# Patient Record
Sex: Female | Born: 1995 | Race: White | Hispanic: No | Marital: Single | Smoking: Never smoker
Health system: Southern US, Community
[De-identification: ages and names within clinical notes are randomized; demographics above are authoritative.]

## PROBLEM LIST (undated history)

## (undated) DIAGNOSIS — N898 Other specified noninflammatory disorders of vagina: Principal | ICD-10-CM

## (undated) DIAGNOSIS — N926 Irregular menstruation, unspecified: Principal | ICD-10-CM

## (undated) DIAGNOSIS — F419 Anxiety disorder, unspecified: Secondary | ICD-10-CM

## (undated) DIAGNOSIS — B373 Candidiasis of vulva and vagina: Secondary | ICD-10-CM

## (undated) DIAGNOSIS — D573 Sickle-cell trait: Secondary | ICD-10-CM

## (undated) DIAGNOSIS — Z8619 Personal history of other infectious and parasitic diseases: Principal | ICD-10-CM

## (undated) DIAGNOSIS — F32A Depression, unspecified: Secondary | ICD-10-CM

## (undated) DIAGNOSIS — A749 Chlamydial infection, unspecified: Secondary | ICD-10-CM

## (undated) DIAGNOSIS — R011 Cardiac murmur, unspecified: Secondary | ICD-10-CM

## (undated) HISTORY — DX: Other specified noninflammatory disorders of vagina: N89.8

## (undated) HISTORY — DX: Sickle-cell trait: D57.3

## (undated) HISTORY — DX: Irregular menstruation, unspecified: N92.6

## (undated) HISTORY — PX: NO PAST SURGERIES: SHX2092

## (undated) HISTORY — DX: Chlamydial infection, unspecified: A74.9

## (undated) HISTORY — DX: Cardiac murmur, unspecified: R01.1

## (undated) HISTORY — DX: Candidiasis of vulva and vagina: B37.3

## (undated) HISTORY — DX: Personal history of other infectious and parasitic diseases: Z86.19

---
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
## 2016-03-19 ENCOUNTER — Encounter: Payer: Self-pay | Admitting: Obstetrics and Gynecology

## 2016-03-19 ENCOUNTER — Ambulatory Visit (INDEPENDENT_AMBULATORY_CARE_PROVIDER_SITE_OTHER): Payer: Medicaid Other | Admitting: Obstetrics and Gynecology

## 2016-03-19 VITALS — BP 120/80 | Ht 63.5 in | Wt 198.0 lb

## 2016-03-19 DIAGNOSIS — A499 Bacterial infection, unspecified: Secondary | ICD-10-CM

## 2016-03-19 DIAGNOSIS — N760 Acute vaginitis: Secondary | ICD-10-CM | POA: Diagnosis not present

## 2016-03-19 DIAGNOSIS — B9689 Other specified bacterial agents as the cause of diseases classified elsewhere: Secondary | ICD-10-CM

## 2016-03-19 DIAGNOSIS — N898 Other specified noninflammatory disorders of vagina: Secondary | ICD-10-CM

## 2016-03-19 MED ORDER — METRONIDAZOLE 500 MG PO TABS: 500.0000 mg | ORAL_TABLET | Freq: Two times a day (BID) | ORAL | Status: DC

## 2016-03-19 NOTE — Progress Notes (Signed)
   [facility]  Patient name: Carmen Sanders MRN [id number]  Date of birth: 03/20/1996  CC & HPI:  Carmen Sanders is a 20 y.o. female presenting today for recurrent, white, thick, vaginal discharge that began in the past week. She reports a history of similar symptoms in the past that were diagnosed as yeast infections/BV. She notes a resolved, transient episode of mild bleeding several days ago that she noticed when she wiped. She states she is not currently using contraceptives.  LMP: 02/13/16  ROS:  ROS Positive for resolved, transient episode of vaginal bleeding (mild) Positive for white, thick, vaginal discharge  Pertinent History Reviewed:   Reviewed: Significant for irregular menstrual bleeding, yeast vaginitis, vaginal discharge, laparoscopic appendectomy Medical         Past Medical History  Diagnosis Date  . Heart murmur   . Sickle cell trait (HCC)   . Irregular menstrual bleeding 08/07/2014  . Yeast vaginitis 08/07/2014  . Vaginal discharge 04/25/2015                              Surgical Hx:    Past Surgical History  Procedure Laterality Date  . No past surgeries    . Laparoscopic appendectomy N/A 07/26/2015    Procedure: APPENDECTOMY LAPAROSCOPIC;  Surgeon: Franky MachoMark Jenkins, MD;  Location: [facility];  Service: General;  Laterality: N/A;   Medications: Reviewed & Updated - see associated section                      No current outpatient prescriptions on file.   Social History: Reviewed -  reports that she has never smoked. She has never used smokeless tobacco.  Objective Findings:  Vitals: Blood pressure 120/80, height 5' 3.5" (1.613 m), weight 198 lb (89.812 kg), last menstrual period 02/14/2016.  Physical Examination: General appearance - alert, well appearing, and in no distress, oriented to person, place, and time and overweight Mental status - alert, oriented to person, place, and time, normal mood, behavior, speech, dress, motor activity, and thought  processes, affect appropriate to mood Pelvic -   VULVA: normal appearing vulva with no masses, tenderness or lesions,  VAGINA: moderate amount of white vaginal discharge CERVIX: normal appearing cervix without discharge or lesions  Wet Prep Negative for yeast, negative for BV clue cells, but contaminated by Lubricant.negative for trichomonas  Assessment & Plan:   A:  1. Vaginal discharge.   2. ? bv  P:  1. Rx metronidazole for 7 days.  By signing my name below, I, Ronney LionSuzanne Le, attest that this documentation has been prepared under the direction and in the presence of Tilda BurrowJohn Miranda Frese V, MD. Electronically Signed: Ronney LionSuzanne Le, ED Scribe. 03/19/2016. 3:10 PM.  I personally performed the services described in this documentation, which was SCRIBED in my presence. The recorded information has been reviewed and considered accurate. It has been edited as necessary during review. Tilda BurrowFERGUSON,Susie Pousson V, MD

## 2016-04-22 ENCOUNTER — Ambulatory Visit (INDEPENDENT_AMBULATORY_CARE_PROVIDER_SITE_OTHER): Payer: Medicaid Other | Admitting: Women's Health

## 2016-04-22 ENCOUNTER — Encounter: Payer: Self-pay | Admitting: Women's Health

## 2016-04-22 VITALS — BP 138/74 | HR 72 | Wt 196.0 lb

## 2016-04-22 DIAGNOSIS — N898 Other specified noninflammatory disorders of vagina: Secondary | ICD-10-CM

## 2016-04-22 DIAGNOSIS — B373 Candidiasis of vulva and vagina: Secondary | ICD-10-CM | POA: Diagnosis not present

## 2016-04-22 DIAGNOSIS — Z3202 Encounter for pregnancy test, result negative: Secondary | ICD-10-CM | POA: Diagnosis not present

## 2016-04-22 DIAGNOSIS — B3731 Acute candidiasis of vulva and vagina: Secondary | ICD-10-CM

## 2016-04-22 DIAGNOSIS — N926 Irregular menstruation, unspecified: Secondary | ICD-10-CM

## 2016-04-22 LAB — POCT WET PREP (WET MOUNT): Clue Cells Wet Prep Whiff POC: NEGATIVE

## 2016-04-22 LAB — POCT URINE PREGNANCY: Preg Test, Ur: NEGATIVE

## 2016-04-22 MED ORDER — FLUCONAZOLE 150 MG PO TABS: 150.0000 mg | ORAL_TABLET | Freq: Once | ORAL | Status: DC

## 2016-04-22 NOTE — Patient Instructions (Signed)
Condoms always for Sexually Transmitted Disease prevention Get back on birth control before sexually active again Take pregnancy test weekly until period starts  Monilial Vaginitis Vaginitis in a soreness, swelling and redness (inflammation) of the vagina and vulva. Monilial vaginitis is not a sexually transmitted infection. CAUSES  Yeast vaginitis is caused by yeast (candida) that is normally found in your vagina. With a yeast infection, the candida has overgrown in number to a point that upsets the chemical balance. SYMPTOMS   White, thick vaginal discharge.  Swelling, itching, redness and irritation of the vagina and possibly the lips of the vagina (vulva).  Burning or painful urination.  Painful intercourse. DIAGNOSIS  Things that may contribute to monilial vaginitis are:  Postmenopausal and virginal states.  Pregnancy.  Infections.  Being tired, sick or stressed, especially if you had monilial vaginitis in the past.  Diabetes. Good control will help lower the chance.  Birth control pills.  Tight fitting garments.  Using bubble bath, feminine sprays, douches or deodorant tampons.  Taking certain medications that kill germs (antibiotics).  Sporadic recurrence can occur if you become ill. TREATMENT  Your caregiver will give you medication.  There are several kinds of anti monilial vaginal creams and suppositories specific for monilial vaginitis. For recurrent yeast infections, use a suppository or cream in the vagina 2 times a week, or as directed.  Anti-monilial or steroid cream for the itching or irritation of the vulva may also be used. Get your caregiver's permission.  Painting the vagina with methylene blue solution may help if the monilial cream does not work.  Eating yogurt may help prevent monilial vaginitis. HOME CARE INSTRUCTIONS   Finish all medication as prescribed.  Do not have sex until treatment is completed or after your caregiver tells you it is  okay.  Take warm sitz baths.  Do not douche.  Do not use tampons, especially scented ones.  Wear cotton underwear.  Avoid tight pants and panty hose.  Tell your sexual partner that you have a yeast infection. They should go to their caregiver if they have symptoms such as mild rash or itching.  Your sexual partner should be treated as well if your infection is difficult to eliminate.  Practice safer sex. Use condoms.  Some vaginal medications cause latex condoms to fail. Vaginal medications that harm condoms are:  Cleocin cream.  Butoconazole (Femstat).  Terconazole (Terazol) vaginal suppository.  Miconazole (Monistat) (may be purchased over the counter). SEEK MEDICAL CARE IF:   You have a temperature by mouth above 102 F (38.9 C).  The infection is getting worse after 2 days of treatment.  The infection is not getting better after 3 days of treatment.  You develop blisters in or around your vagina.  You develop vaginal bleeding, and it is not your menstrual period.  You have pain when you urinate.  You develop intestinal problems.  You have pain with sexual intercourse.   This information is not intended to replace advice given to you by your health care provider. Make sure you discuss any questions you have with your health care provider.   Document Released: 09/10/2005 Document Revised: 02/23/2012 Document Reviewed: 06/04/2015 Elsevier Interactive Patient Education [employer]2016 Elsevier Inc.

## 2016-04-22 NOTE — Progress Notes (Signed)
Patient ID: Carmen BrasCierra N Mcweeney, female   DOB: 04/02/1996, 20 y.o.   MRN: [id number]   [facility]  Patient name: Carmen Sanders MRN [id number]  Date of birth: 03/27/1996  CC & HPI:  Carmen Sanders is a 20 y.o. G0P0 Caucasian female presenting today for report of d/c w/ odor and itching x few days and period 1 wk late. Not currently taking coc's- stopped them when she came home from college and plans to restart when she goes back to college, as she states she doesn't have sex w/ anyone here. Had unprotected sex ~641mth ago.  Then again more recently but used condom.  Patient's last menstrual period was 03/16/2016.  Pertinent History Reviewed:  Medical & Surgical Hx:   Past medical, surgical, family, and social history reviewed in electronic medical record Medications: Reviewed & Updated - see associated section Allergies: Reviewed in electronic medical record  Objective Findings:  Vitals: BP 138/74 mmHg  Pulse 72  Wt 196 lb (88.905 kg)  LMP 03/16/2016 Body mass index is 34.17 kg/(m^2).  Physical Examination: General appearance - alert, well appearing, and in no distress Pelvic - normal external genitalia, cx clear, large amt thick clumpy nonodorous d/c adherent to vag walls c/w yeast  Results for orders placed or performed in visit on 04/22/16 (from the past 24 hour(s))  POCT urine pregnancy   Collection Time: 04/22/16  2:37 PM  Result Value Ref Range   Preg Test, Ur Negative Negative  POCT Wet Prep Mellody Drown(Wet [place])   Collection Time: 04/22/16  2:52 PM  Result Value Ref Range   Source Wet Prep POC vaginal    WBC, Wet Prep HPF POC many    Bacteria Wet Prep HPF POC None None, Few, Too numerous to count   BACTERIA WET PREP MORPHOLOGY POC     Clue Cells Wet Prep HPF POC None None, Too numerous to count   Clue Cells Wet Prep Whiff POC Negative Whiff    Yeast Wet Prep HPF POC Moderate    KOH Wet Prep POC     Trichomonas Wet Prep HPF POC none      Assessment & Plan:  A:    Vulvovaginal candida  Late period  P:  Rx diflucan 1 now, repeat in 3d if needed  Get back on coc's prior to any further sexual activity  Condoms always for STI prevention  GC/CT from urine  Take HPT weekly until period starts  Return for prn.  Marge DuncansBooker, Coren Sagan Randall CNM, [facility]NP[facility]BC 04/22/2016 2:55 PM

## 2016-04-23 LAB — GC/CHLAMYDIA PROBE AMP
CHLAMYDIA, DNA PROBE: NEGATIVE
NEISSERIA GONORRHOEAE BY PCR: NEGATIVE

## 2016-06-06 ENCOUNTER — Ambulatory Visit: Payer: Medicaid Other | Admitting: Adult Health

## 2016-06-16 ENCOUNTER — Encounter: Payer: Self-pay | Admitting: Adult Health

## 2016-06-16 ENCOUNTER — Ambulatory Visit (INDEPENDENT_AMBULATORY_CARE_PROVIDER_SITE_OTHER): Payer: Medicaid Other | Admitting: Adult Health

## 2016-06-16 VITALS — BP 110/68 | HR 82 | Ht 63.5 in | Wt 200.0 lb

## 2016-06-16 DIAGNOSIS — L298 Other pruritus: Secondary | ICD-10-CM | POA: Diagnosis not present

## 2016-06-16 DIAGNOSIS — N760 Acute vaginitis: Secondary | ICD-10-CM

## 2016-06-16 DIAGNOSIS — N898 Other specified noninflammatory disorders of vagina: Secondary | ICD-10-CM | POA: Diagnosis not present

## 2016-06-16 DIAGNOSIS — A499 Bacterial infection, unspecified: Secondary | ICD-10-CM | POA: Diagnosis not present

## 2016-06-16 DIAGNOSIS — B9689 Other specified bacterial agents as the cause of diseases classified elsewhere: Secondary | ICD-10-CM

## 2016-06-16 HISTORY — DX: Other specified noninflammatory disorders of vagina: N89.8

## 2016-06-16 LAB — POCT WET PREP (WET MOUNT)
Clue Cells Wet Prep Whiff POC: POSITIVE
WBC WET PREP: POSITIVE

## 2016-06-16 MED ORDER — FLUCONAZOLE 150 MG PO TABS: ORAL_TABLET | ORAL | Status: DC

## 2016-06-16 MED ORDER — METRONIDAZOLE 500 MG PO TABS: 500.0000 mg | ORAL_TABLET | Freq: Three times a day (TID) | ORAL | Status: DC

## 2016-06-16 NOTE — Progress Notes (Signed)
Subjective:     Patient ID: Carmen Sanders, female   DOB: 11/03/1996, 20 y.o.   MRN: [id number]  HPI Carmen Sanders is a 20 year old biracial female in complaining of vaginal itching and some discharge, was treated for yeast in May.She tries to use condoms, but has not every time.   Review of Systems Patient denies any headaches, hearing loss, fatigue, blurred vision, shortness of breath, chest pain, abdominal pain, problems with bowel movements, urination, or intercourse. No joint pain or mood swings.See HPI for positives.  Reviewed past medical,surgical, social and family history. Reviewed medications and allergies.     Objective:   Physical Exam BP 110/68 mmHg  Pulse 82  Ht 5' 3.5" (1.613 m)  Wt 200 lb (90.719 kg)  BMI 34.87 kg/m2  LMP 06/04/2016 Skin warm and dry.Pelvic: external genitalia is normal in appearance no lesions, vagina: white,frothy discharge with odor,urethra has no lesions or masses noted, cervix:smooth, uterus: normal size, shape and contour, non tender, no masses felt, adnexa: no masses or tenderness noted. Bladder is non tender and no masses felt.Abdomens is soft and non tender. Wet prep: + for clue cells and +WBCs. GC/CHL obtained. Discussed using condoms every time, no thongs or tub baths. Face time 15 minutes.    Assessment:     Vaginal itching Vaginal discharge BV     Plan:    Use condoms GC/CHL sent Rx flagyl 500 mg 1 tid x 7 days,#21, no alcohol, review handout on BV   Rx diflucan 150 mg #6 take 1 now and 1 before periods, with 2 refills Follow up prn

## 2016-06-16 NOTE — Patient Instructions (Signed)
Bacterial Vaginosis Bacterial vaginosis is a vaginal infection that occurs when the normal balance of bacteria in the vagina is disrupted. It results from an overgrowth of certain bacteria. This is the most common vaginal infection in women of childbearing age. Treatment is important to prevent complications, especially in pregnant women, as it can cause a premature delivery. CAUSES  Bacterial vaginosis is caused by an increase in harmful bacteria that are normally present in smaller amounts in the vagina. Several different kinds of bacteria can cause bacterial vaginosis. However, the reason that the condition develops is not fully understood. RISK FACTORS Certain activities or behaviors can put you at an increased risk of developing bacterial vaginosis, including:  Having a new sex partner or multiple sex partners.  Douching.  Using an intrauterine device (IUD) for contraception. Women do not get bacterial vaginosis from toilet seats, bedding, swimming pools, or contact with objects around them. SIGNS AND SYMPTOMS  Some women with bacterial vaginosis have no signs or symptoms. Common symptoms include:  Grey vaginal discharge.  A fishlike odor with discharge, especially after sexual intercourse.  Itching or burning of the vagina and vulva.  Burning or pain with urination. DIAGNOSIS  Your health care provider will take a medical history and examine the vagina for signs of bacterial vaginosis. A sample of vaginal fluid may be taken. Your health care provider will look at this sample under a microscope to check for bacteria and abnormal cells. A vaginal pH test may also be done.  TREATMENT  Bacterial vaginosis may be treated with antibiotic medicines. These may be given in the form of a pill or a vaginal cream. A second round of antibiotics may be prescribed if the condition comes back after treatment. Because bacterial vaginosis increases your risk for sexually transmitted diseases, getting  treated can help reduce your risk for chlamydia, gonorrhea, HIV, and herpes. HOME CARE INSTRUCTIONS   Only take over-the-counter or prescription medicines as directed by your health care provider.  If antibiotic medicine was prescribed, take it as directed. Make sure you finish it even if you start to feel better.  Tell all sexual partners that you have a vaginal infection. They should see their health care provider and be treated if they have problems, such as a mild rash or itching.  During treatment, it is important that you follow these instructions:  Avoid sexual activity or use condoms correctly.  Do not douche.  Avoid alcohol as directed by your health care provider.  Avoid breastfeeding as directed by your health care provider. SEEK MEDICAL CARE IF:   Your symptoms are not improving after 3 days of treatment.  You have increased discharge or pain.  You have a fever. MAKE SURE YOU:   Understand these instructions.  Will watch your condition.  Will get help right away if you are not doing well or get worse. FOR MORE INFORMATION  Centers for Disease Control and Prevention, Division of STD Prevention: SolutionApps.co.zawww.cdc.gov/std American Sexual Health Association (ASHA): www.ashastd.org    This information is not intended to replace advice given to you by your health care provider. Make sure you discuss any questions you have with your health care provider.   Document Released: 12/01/2005 Document Revised: 12/22/2014 Document Reviewed: 07/13/2013 Elsevier Interactive Patient Education 2016 [employer] No alcohol or sex while on flagyl Follow up prn  Use condoms

## 2016-06-20 LAB — GC/CHLAMYDIA PROBE AMP
CHLAMYDIA, DNA PROBE: POSITIVE — AB
Neisseria gonorrhoeae by PCR: NEGATIVE

## 2016-06-23 ENCOUNTER — Encounter: Payer: Self-pay | Admitting: Adult Health

## 2016-06-23 ENCOUNTER — Telehealth: Payer: Self-pay | Admitting: Adult Health

## 2016-06-23 ENCOUNTER — Other Ambulatory Visit: Payer: Self-pay | Admitting: Adult Health

## 2016-06-23 DIAGNOSIS — A749 Chlamydial infection, unspecified: Secondary | ICD-10-CM

## 2016-06-23 HISTORY — DX: Chlamydial infection, unspecified: A74.9

## 2016-06-23 MED ORDER — AZITHROMYCIN 500 MG PO TABS: ORAL_TABLET | ORAL | Status: DC

## 2016-06-23 NOTE — Telephone Encounter (Signed)
Pt aware has + CHL, GC was negative, will rx azithromycin 500 mg #2 take 2 po now, no sex for POC 8/8 at 9:45 am ,[facility] sent, she will tell partners to get treated

## 2016-06-23 NOTE — Telephone Encounter (Signed)
left message to call about results

## 2016-06-27 ENCOUNTER — Telehealth: Payer: Self-pay | Admitting: Adult Health

## 2016-06-27 NOTE — Telephone Encounter (Signed)
Mailbox full

## 2016-06-27 NOTE — Telephone Encounter (Signed)
Pt called stating that she would like a call back from DunlevyJennifer, Pt did not state the reason for the call. Please contact pt

## 2016-07-07 ENCOUNTER — Telehealth: Payer: Self-pay | Admitting: Adult Health

## 2016-07-07 NOTE — Telephone Encounter (Signed)
Questions about STDs answered, and she thinks partner may have told her a lie said his was 19% +, told her not sure about that, now she has bump. Make appt to be checked.

## 2016-07-15 ENCOUNTER — Encounter: Payer: Self-pay | Admitting: Adult Health

## 2016-07-15 ENCOUNTER — Ambulatory Visit (INDEPENDENT_AMBULATORY_CARE_PROVIDER_SITE_OTHER): Payer: Medicaid Other | Admitting: Adult Health

## 2016-07-15 VITALS — BP 132/80 | HR 88 | Ht 63.5 in | Wt 196.0 lb

## 2016-07-15 DIAGNOSIS — N898 Other specified noninflammatory disorders of vagina: Secondary | ICD-10-CM | POA: Diagnosis not present

## 2016-07-15 DIAGNOSIS — Z8619 Personal history of other infectious and parasitic diseases: Secondary | ICD-10-CM | POA: Diagnosis not present

## 2016-07-15 DIAGNOSIS — L298 Other pruritus: Secondary | ICD-10-CM | POA: Diagnosis not present

## 2016-07-15 HISTORY — DX: Personal history of other infectious and parasitic diseases: Z86.19

## 2016-07-15 LAB — POCT WET PREP (WET MOUNT): Clue Cells Wet Prep Whiff POC: NEGATIVE

## 2016-07-15 NOTE — Patient Instructions (Signed)
Use condoms  

## 2016-07-15 NOTE — Progress Notes (Signed)
Subjective:     Patient ID: Carmen Sanders, female   DOB: [birth year], 20 y.o.   MRN: [id number]  HPI Carmen Sanders is a 20 year old white female in for proof of treatment for recent +chlamydia, and she has some itching.She took her meds and has not had sex.  Review of Systems Patient denies any headaches, hearing loss, fatigue, blurred vision, shortness of breath, chest pain, abdominal pain, problems with bowel movements, urination, or intercourse. No joint pain or mood swings.+itching in vagina  Reviewed past medical,surgical, social and family history. Reviewed medications and allergies.     Objective:   Physical Exam BP 132/80 (BP Location: Left Arm, Patient Position: Sitting, Cuff Size: Normal)   Pulse 88   Ht 5' 3.5" (1.613 m)   Wt 196 lb (88.9 kg)   LMP 07/01/2016 (Exact Date)   BMI 34.18 kg/m  Skin warm and dry.Pelvic: external genitalia is normal in appearance no lesions, vagina: white discharge without odor,urethra has no lesions or masses noted, cervix:smooth, uterus: normal size, shape and contour, non tender, no masses felt, adnexa: no masses or tenderness noted. Bladder is non tender and no masses felt. Wet prep: +WBCs. GC/CHL obtained. Abdomen is soft and non tender.   Discussed using condoms every time, all the time. Assessment:     History of chlamydia Vaginal itching Vaginal discharge     Plan:     GC/CHL sent Use condoms  Follow up prn

## 2016-07-16 ENCOUNTER — Telehealth: Payer: Self-pay | Admitting: Adult Health

## 2016-07-16 NOTE — Telephone Encounter (Signed)
Pt's GC/CHL results are not back yet. Pt was advised to call back tomorrow midday. Pt voiced understanding. JSY

## 2016-07-16 NOTE — Telephone Encounter (Signed)
Pt called stating that she would like to know the results of her blood work. Please contact pt °

## 2016-07-17 LAB — GC/CHLAMYDIA PROBE AMP
Chlamydia trachomatis, NAA: NEGATIVE
Neisseria gonorrhoeae by PCR: NEGATIVE

## 2016-07-22 ENCOUNTER — Ambulatory Visit: Payer: Medicaid Other | Admitting: Adult Health

## 2016-09-08 ENCOUNTER — Ambulatory Visit (INDEPENDENT_AMBULATORY_CARE_PROVIDER_SITE_OTHER): Payer: Medicaid Other | Admitting: Women's Health

## 2016-09-08 ENCOUNTER — Encounter: Payer: Self-pay | Admitting: Women's Health

## 2016-09-08 VITALS — BP 120/70 | HR 72 | Ht 63.0 in | Wt 198.0 lb

## 2016-09-08 DIAGNOSIS — N898 Other specified noninflammatory disorders of vagina: Secondary | ICD-10-CM | POA: Diagnosis not present

## 2016-09-08 DIAGNOSIS — N926 Irregular menstruation, unspecified: Secondary | ICD-10-CM

## 2016-09-08 DIAGNOSIS — Z3202 Encounter for pregnancy test, result negative: Secondary | ICD-10-CM | POA: Diagnosis not present

## 2016-09-08 LAB — POCT WET PREP (WET MOUNT)
Clue Cells Wet Prep Whiff POC: NEGATIVE
TRICHOMONAS WET PREP HPF POC: ABSENT

## 2016-09-08 LAB — POCT URINE PREGNANCY: Preg Test, Ur: NEGATIVE

## 2016-09-08 NOTE — Progress Notes (Signed)
   [facility] Visit  Patient name: Carmen Sanders MRN [id number]  Date of birth: 02/10/1996  CC & HPI:  Carmen Sanders is a 20 y.o. G0P0 African American female presenting today for report of vag d/c, odor/itching x 2wks, used otc monistat which didn't help. [facility] [employer], states they don't take Mcaid. Period is late, usually very regular. Did miss using condom once about 4wks ago. Does not want pregnancy right now.  Patient's last menstrual period was 07/31/2016. The current method of family planning is condoms most of the time. Last pap <21yo  Pertinent History Reviewed:  Medical & Surgical Hx:   Past medical, surgical, family, and social history reviewed in electronic medical record Medications: Reviewed & Updated - see associated section Allergies: Reviewed in electronic medical record  Objective Findings:  Vitals: BP 120/70   Pulse 72   Ht 5\' 3"  (1.6 m)   Wt 198 lb (89.8 kg)   LMP 07/31/2016   BMI 35.07 kg/m  Body mass index is 35.07 kg/m.  Physical Examination: General appearance - alert, well appearing, and in no distress Pelvic - cx clear w/o abnormalities, mod amt creamy white nonodorous d/c  Results for orders placed or performed in visit on 09/08/16 (from the past 24 hour(s))  POCT urine pregnancy   Collection Time: 09/08/16 11:32 AM  Result Value Ref Range   Preg Test, Ur Negative Negative  POCT Wet Prep Mellody Drown(Wet [place])   Collection Time: 09/08/16 11:46 AM  Result Value Ref Range   Source Wet Prep POC vaginal    WBC, Wet Prep HPF POC many    Bacteria Wet Prep HPF POC None None, Few, Too numerous to count   BACTERIA WET PREP MORPHOLOGY POC     Clue Cells Wet Prep HPF POC None None, Too numerous to count   Clue Cells Wet Prep Whiff POC Negative Whiff    Yeast Wet Prep HPF POC None    KOH Wet Prep POC     Trichomonas Wet Prep HPF POC Absent Absent     Assessment & Plan:  A:   Vag d/c, wet prep only many wbc's  Late  period  P:  Send urine for gc/ct  Pregnancy test today neg  Wear condoms always for STI/pregnancy prevention  If still hasn't started period next week, take another pregnancy test, if + start pnv and call for appt  Return for prn.  Marge DuncansBooker, Syndey Jaskolski Randall CNM, [facility]NP-BC 09/08/2016 11:46 AM

## 2016-09-10 LAB — GC/CHLAMYDIA PROBE AMP
CHLAMYDIA, DNA PROBE: NEGATIVE
Neisseria gonorrhoeae by PCR: NEGATIVE

## 2016-10-13 ENCOUNTER — Ambulatory Visit: Payer: Medicaid Other | Admitting: Women's Health

## 2016-10-20 ENCOUNTER — Encounter: Payer: Self-pay | Admitting: Adult Health

## 2016-10-20 ENCOUNTER — Ambulatory Visit (INDEPENDENT_AMBULATORY_CARE_PROVIDER_SITE_OTHER): Payer: Medicaid Other | Admitting: Adult Health

## 2016-10-20 VITALS — BP 122/80 | HR 70 | Ht 63.5 in | Wt 197.0 lb

## 2016-10-20 DIAGNOSIS — N760 Acute vaginitis: Secondary | ICD-10-CM | POA: Diagnosis not present

## 2016-10-20 DIAGNOSIS — L298 Other pruritus: Secondary | ICD-10-CM | POA: Diagnosis not present

## 2016-10-20 DIAGNOSIS — Z113 Encounter for screening for infections with a predominantly sexual mode of transmission: Secondary | ICD-10-CM

## 2016-10-20 DIAGNOSIS — N898 Other specified noninflammatory disorders of vagina: Secondary | ICD-10-CM | POA: Diagnosis not present

## 2016-10-20 DIAGNOSIS — B9689 Other specified bacterial agents as the cause of diseases classified elsewhere: Secondary | ICD-10-CM

## 2016-10-20 LAB — POCT WET PREP (WET MOUNT)
Clue Cells Wet Prep Whiff POC: POSITIVE
WBC WET PREP: POSITIVE

## 2016-10-20 MED ORDER — METRONIDAZOLE 500 MG PO TABS: 500.0000 mg | ORAL_TABLET | Freq: Three times a day (TID) | ORAL | 1 refills | Status: DC

## 2016-10-20 NOTE — Progress Notes (Signed)
Subjective:     Patient ID: Carmen Sanders, female   DOB: 06/16/1996, 20 y.o.   MRN: [id number]  HPI Carmen MassonCierra is a 20 year old white female in complaining of vaginal discharge with odor and itching.Has had unprotected sex.  Review of Systems +vaginal discharge +vaginal odor + vaginal itching Reviewed past medical,surgical, social and family history. Reviewed medications and allergies.     Objective:   Physical Exam BP 122/80 (BP Location: Left Arm, Patient Position: Sitting, Cuff Size: Normal)   Pulse 70   Ht 5' 3.5" (1.613 m)   Wt 197 lb (89.4 kg)   LMP 10/07/2016 (Approximate)   BMI 34.35 kg/m  Skin warm and dry.Pelvic: external genitalia is normal in appearance no lesions, vagina: white discharge with odor,urethra has no lesions or masses noted, cervix:smooth, uterus: normal size, shape and contour, non tender, no masses felt, adnexa: no masses or tenderness noted. Bladder is non tender and no masses felt. Wet prep: + for clue cells and +WBCs. GC/CHL obtained.    PHQ 9 score 11, she denies depression but stress at college.She thinks she wants nexplanon, will give handout to review and will order when home at Thanksgiving.  Assessment:     1. Vaginal discharge   2. Vaginal itching   3. Vaginal odor   4. BV (bacterial vaginosis)   5. Screening examination for STD (sexually transmitted disease)       Plan:     Rx flagyl 500 mg 1 tid x 7 days,#21 with 1 refill no alcohol, review handout on BV,no sex during treatment.  GC/CHL sent Check HIV and RPR Review handout on nexplanon

## 2016-10-20 NOTE — Patient Instructions (Signed)
Bacterial Vaginosis Bacterial vaginosis is a vaginal infection that occurs when the normal balance of bacteria in the vagina is disrupted. It results from an overgrowth of certain bacteria. This is the most common vaginal infection in women of childbearing age. Treatment is important to prevent complications, especially in pregnant women, as it can cause a premature delivery. CAUSES  Bacterial vaginosis is caused by an increase in harmful bacteria that are normally present in smaller amounts in the vagina. Several different kinds of bacteria can cause bacterial vaginosis. However, the reason that the condition develops is not fully understood. RISK FACTORS Certain activities or behaviors can put you at an increased risk of developing bacterial vaginosis, including:  Having a new sex partner or multiple sex partners.  Douching.  Using an intrauterine device (IUD) for contraception. Women do not get bacterial vaginosis from toilet seats, bedding, swimming pools, or contact with objects around them. SIGNS AND SYMPTOMS  Some women with bacterial vaginosis have no signs or symptoms. Common symptoms include:  Grey vaginal discharge.  A fishlike odor with discharge, especially after sexual intercourse.  Itching or burning of the vagina and vulva.  Burning or pain with urination. DIAGNOSIS  Your health care provider will take a medical history and examine the vagina for signs of bacterial vaginosis. A sample of vaginal fluid may be taken. Your health care provider will look at this sample under a microscope to check for bacteria and abnormal cells. A vaginal pH test may also be done.  TREATMENT  Bacterial vaginosis may be treated with antibiotic medicines. These may be given in the form of a pill or a vaginal cream. A second round of antibiotics may be prescribed if the condition comes back after treatment. Because bacterial vaginosis increases your risk for sexually transmitted diseases, getting  treated can help reduce your risk for chlamydia, gonorrhea, HIV, and herpes. HOME CARE INSTRUCTIONS   Only take over-the-counter or prescription medicines as directed by your health care provider.  If antibiotic medicine was prescribed, take it as directed. Make sure you finish it even if you start to feel better.  Tell all sexual partners that you have a vaginal infection. They should see their health care provider and be treated if they have problems, such as a mild rash or itching.  During treatment, it is important that you follow these instructions:  Avoid sexual activity or use condoms correctly.  Do not douche.  Avoid alcohol as directed by your health care provider.  Avoid breastfeeding as directed by your health care provider. SEEK MEDICAL CARE IF:   Your symptoms are not improving after 3 days of treatment.  You have increased discharge or pain.  You have a fever. MAKE SURE YOU:   Understand these instructions.  Will watch your condition.  Will get help right away if you are not doing well or get worse. FOR MORE INFORMATION  Centers for Disease Control and Prevention, Division of STD Prevention: SolutionApps.co.zawww.cdc.gov/std American Sexual Health Association (ASHA): www.ashastd.org    This information is not intended to replace advice given to you by your health care provider. Make sure you discuss any questions you have with your health care provider.   Document Released: 12/01/2005 Document Revised: 12/22/2014 Document Reviewed: 07/13/2013 Elsevier Interactive Patient Education [employer]2016 Elsevier Inc. No alcohol or sex

## 2016-10-21 LAB — RPR: RPR Ser Ql: NONREACTIVE

## 2016-10-21 LAB — HIV ANTIBODY (ROUTINE TESTING W REFLEX): HIV SCREEN 4TH GENERATION: NONREACTIVE

## 2016-10-22 LAB — GC/CHLAMYDIA PROBE AMP
CHLAMYDIA, DNA PROBE: NEGATIVE
Neisseria gonorrhoeae by PCR: NEGATIVE

## 2017-01-30 ENCOUNTER — Encounter: Payer: Self-pay | Admitting: Adult Health

## 2017-01-30 ENCOUNTER — Ambulatory Visit (INDEPENDENT_AMBULATORY_CARE_PROVIDER_SITE_OTHER): Payer: Medicaid Other | Admitting: Adult Health

## 2017-01-30 VITALS — BP 120/80 | HR 80 | Ht 63.0 in | Wt 201.0 lb

## 2017-01-30 DIAGNOSIS — N898 Other specified noninflammatory disorders of vagina: Secondary | ICD-10-CM

## 2017-01-30 DIAGNOSIS — N760 Acute vaginitis: Secondary | ICD-10-CM

## 2017-01-30 DIAGNOSIS — B9689 Other specified bacterial agents as the cause of diseases classified elsewhere: Secondary | ICD-10-CM

## 2017-01-30 LAB — POCT WET PREP (WET MOUNT)
Clue Cells Wet Prep Whiff POC: NEGATIVE
WBC, Wet Prep HPF POC: POSITIVE

## 2017-01-30 MED ORDER — METRONIDAZOLE 500 MG PO TABS: 500.0000 mg | ORAL_TABLET | Freq: Two times a day (BID) | ORAL | 2 refills | Status: DC

## 2017-01-30 NOTE — Progress Notes (Signed)
Subjective:     Patient ID: Carmen Sanders, female   DOB: 04/06/1996, 21 y.o.   MRN: [id number]  HPI Carmen Sanders is a 21 year old white female in complaining of brownish vaginal discharge for about 3 days. [facility] [facility].  Review of Systems +vaginal discharge Reviewed past medical,surgical, social and family history. Reviewed medications and allergies.     Objective:   Physical Exam BP 120/80 (BP Location: Left Arm, Patient Position: Sitting, Cuff Size: Large)   Pulse 80   Ht 5\' 3"  (1.6 m)   Wt 201 lb (91.2 kg)   LMP 01/12/2017   BMI 35.61 kg/m PHQ 2 score 0.    Skin warm and dry.Pelvic: external genitalia is normal in appearance no lesions, vagina: white discharge without odor,urethra has no lesions or masses noted, cervix:smooth, uterus: normal size, shape and contour, non tender, no masses felt, adnexa: no masses or tenderness noted. Bladder is non tender and no masses felt. Wet prep: + for clue cells and +WBCs. GC/CHL obtained.  Use condoms or withdrawal to keep semen from sitting in vagina.  Assessment:     1. Vaginal discharge   2. BV (bacterial vaginosis)       Plan:     Meds ordered this encounter  Medications  . metroNIDAZOLE (FLAGYL) 500 MG tablet    Sig: Take 1 tablet (500 mg total) by mouth 2 (two) times daily.    Dispense:  14 tablet    Refill:  2    Order Specific Question:   Supervising Provider    Answer:   Duane LopeEURE, LUTHER H [[id number]]  Follow up prn No sex or alcohol during treatment

## 2017-02-03 LAB — GC/CHLAMYDIA PROBE AMP
CHLAMYDIA, DNA PROBE: NEGATIVE
Neisseria gonorrhoeae by PCR: NEGATIVE

## 2017-05-19 ENCOUNTER — Ambulatory Visit: Payer: Medicaid Other | Admitting: Adult Health

## 2017-07-18 IMAGING — CT CT ABD-PELV W/ CM
2 of 4 series · 16 of 46 positions shown, 18 images · IV contrast (Omnipaque 300)
Comparison: None.

CLINICAL DATA: Right lower quadrant pain since this morning.

EXAM:
CT ABDOMEN AND PELVIS WITH CONTRAST
TECHNIQUE: Multidetector CT imaging of the abdomen and pelvis was performed
using the standard protocol following bolus administration of
intravenous contrast.
CONTRAST:  25mL OMNIPAQUE IOHEXOL 300 MG/ML SOLN, 100mL OMNIPAQUE
IOHEXOL 300 MG/ML SOLN

[Series 2: abd_pel_with 5.0 b40f · axial · 0.69mm/px · z∈[-424,-24]mm · 13 of 88 slices shown, 15 images]
[im 4/88  soft-tissue]
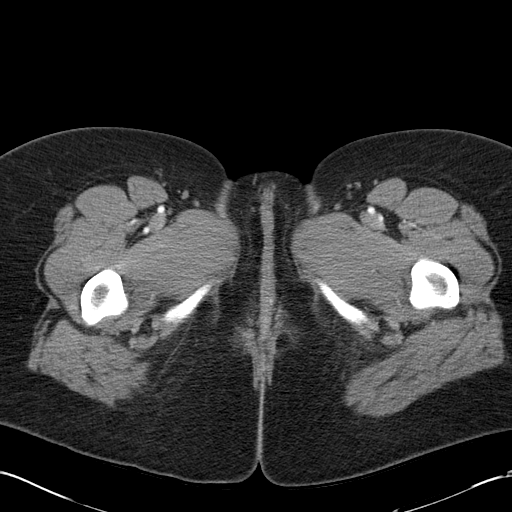
[im 4/88  bone]
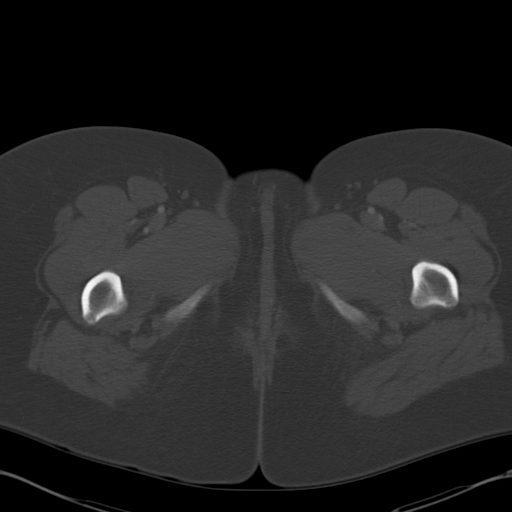
[im 11/88  soft-tissue]
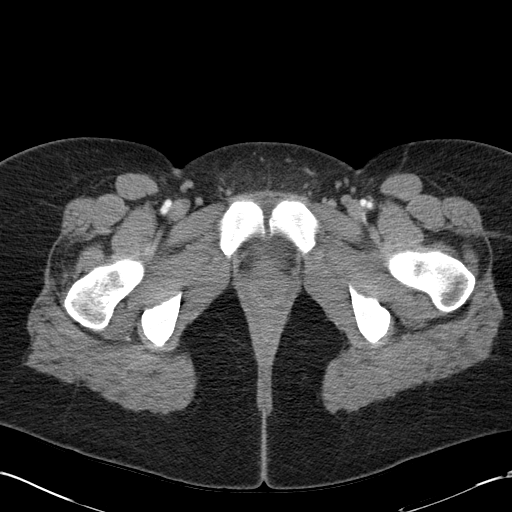
[im 19/88  soft-tissue]
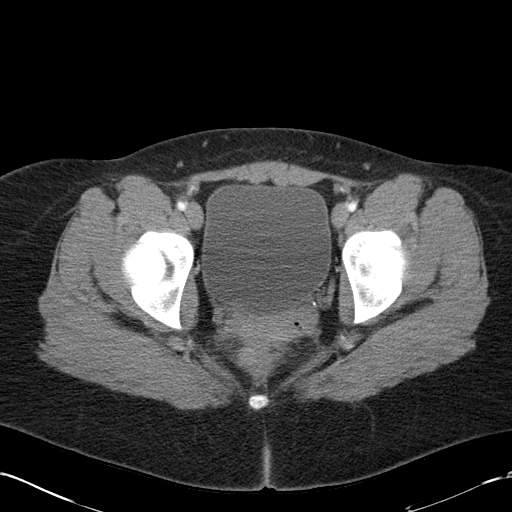
[im 26/88  soft-tissue]
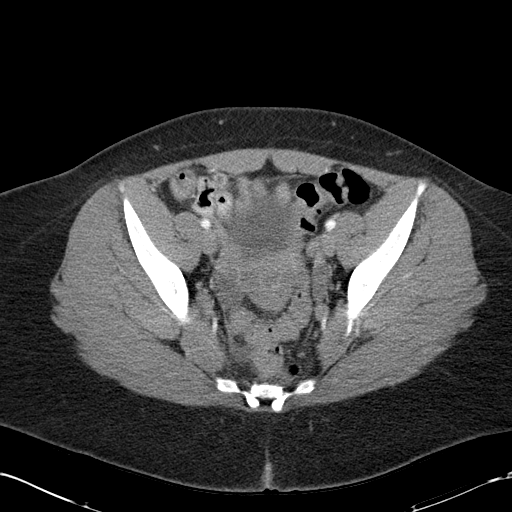
[im 30/88  soft-tissue]
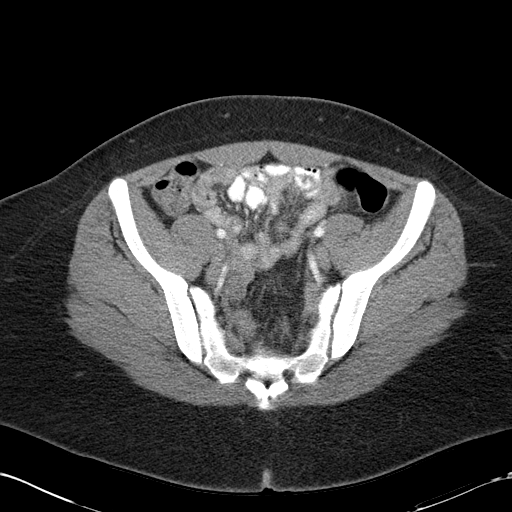
[im 37/88  soft-tissue]
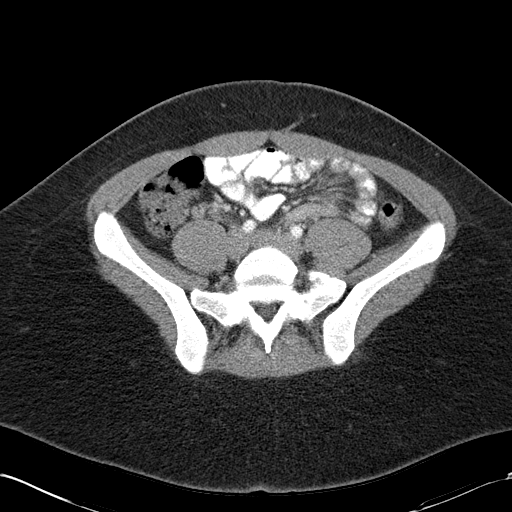
[im 44/88  soft-tissue]
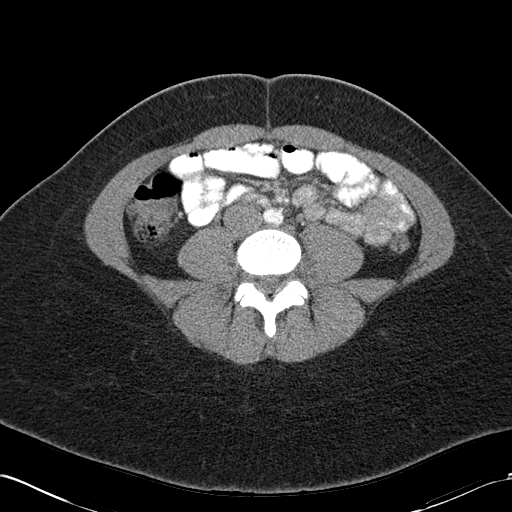
[im 51/88  soft-tissue]
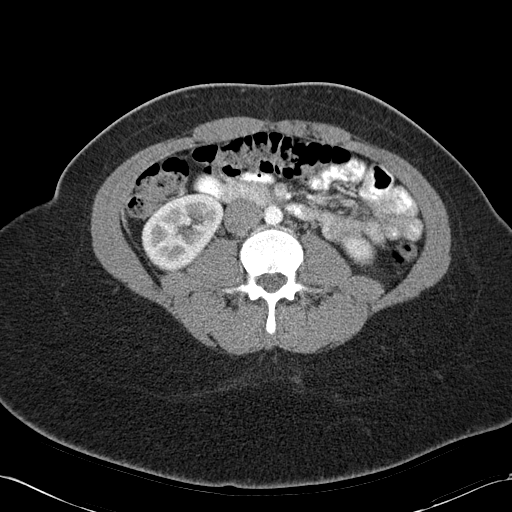
[im 59/88  soft-tissue]
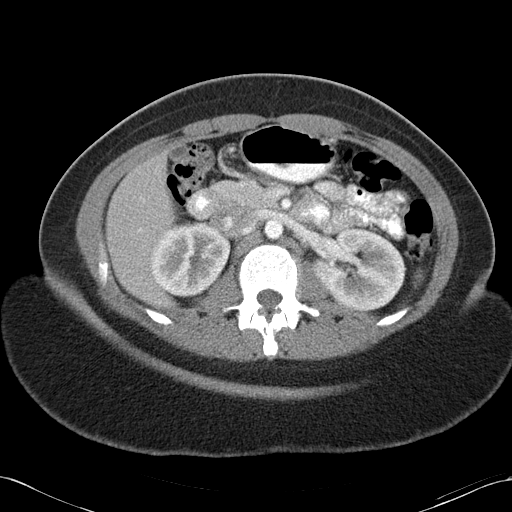
[im 59/88  bone]
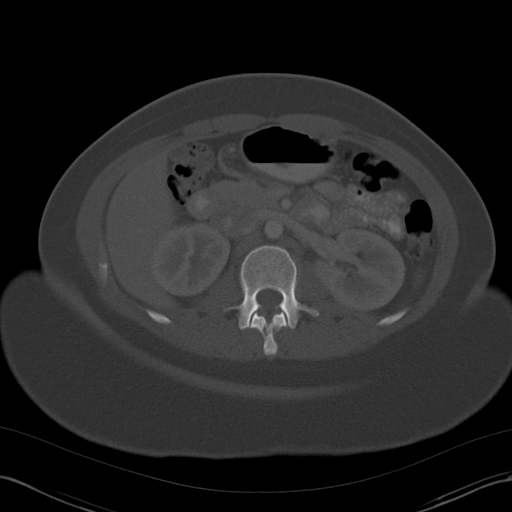
[im 62/88  soft-tissue]
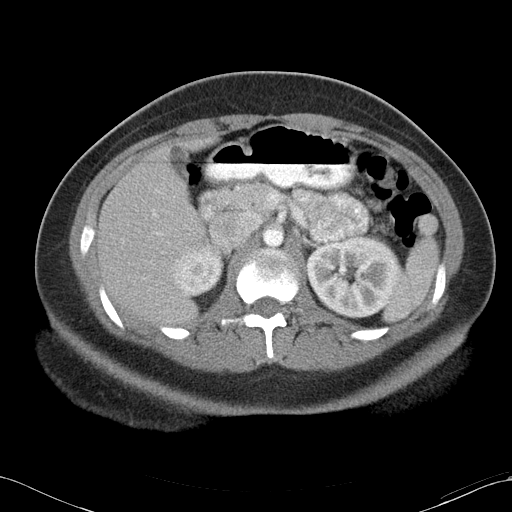
[im 69/88  soft-tissue]
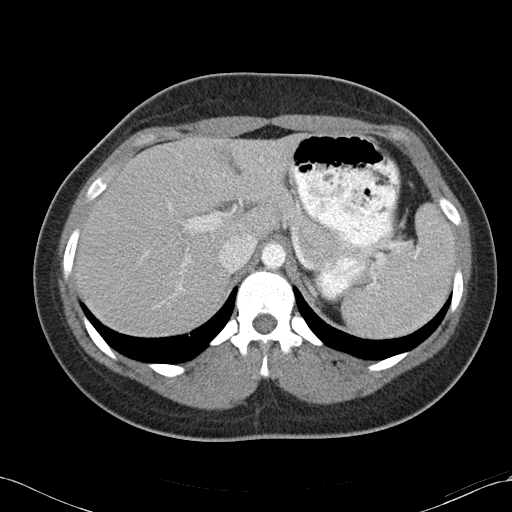
[im 77/88  soft-tissue]
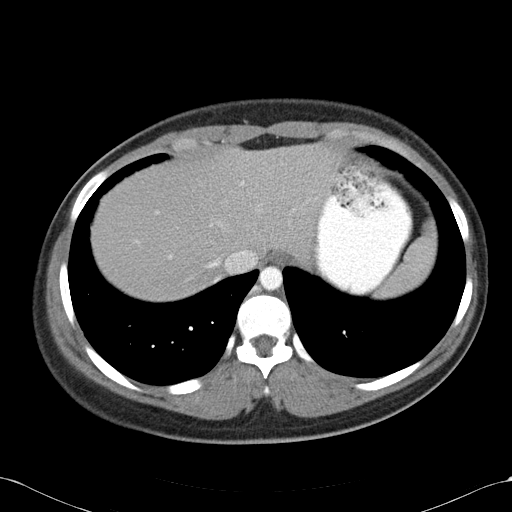
[im 84/88  soft-tissue]
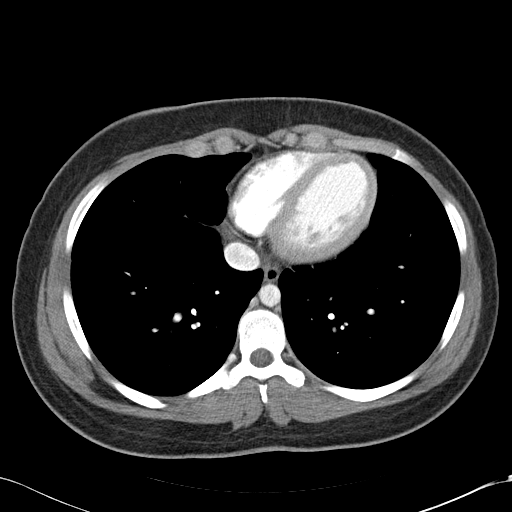

[Series 4: abd_pel_with 3.0 spo cor · coronal · 0.59mm/px · 3 of 80 slices shown]
[im 27/80  soft-tissue]
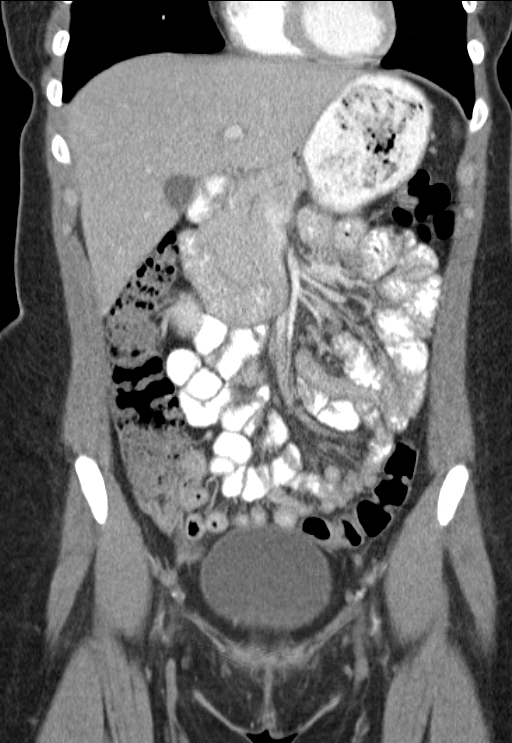
[im 36/80  soft-tissue]
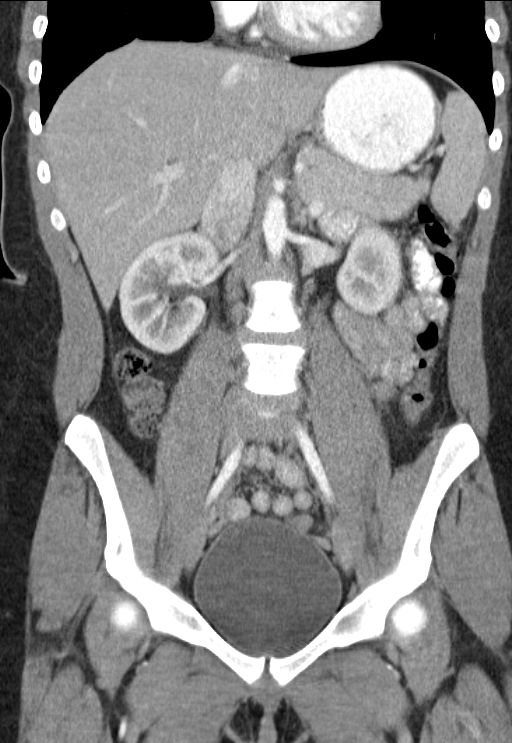
[im 44/80  soft-tissue]
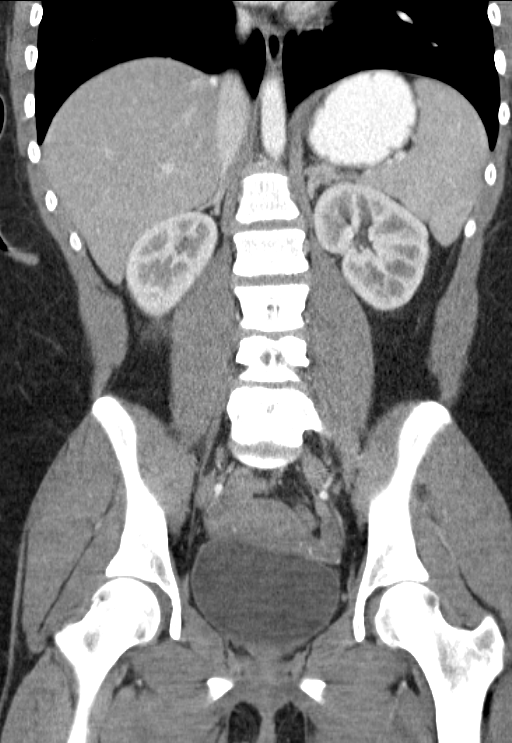

[16 of 46 positions shown; findings below may reference images not displayed]

FINDINGS: Lower chest:  Normal.

Hepatobiliary: Normal.

Pancreas: Normal.

Spleen: Normal.

Adrenals/Urinary Tract: Normal.

Stomach/Bowel: The appendix is slightly inflamed with very subtle of
periappendiceal soft tissue stranding best seen on the sagittal
images. The appendix is enlarged to a diameter of 9 mm. The appendix
lies just deep to the anterior abdominal wall. The bowel otherwise
appears normal.

Vascular/Lymphatic: Normal.

Reproductive: Normal. Tiny amount of free fluid in the pelvis,
normal for a female of this age.

Other: No free air.

Musculoskeletal: Normal.
IMPRESSION: Early acute appendicitis.

## 2022-05-18 ENCOUNTER — Encounter (HOSPITAL_COMMUNITY): Payer: Self-pay

## 2022-05-18 ENCOUNTER — Other Ambulatory Visit: Payer: Self-pay

## 2022-05-18 ENCOUNTER — Emergency Department (HOSPITAL_COMMUNITY)
Admission: EM | Admit: 2022-05-18 | Discharge: 2022-05-18 | Disposition: A | Discharge status: Home / Self Care | Payer: No Typology Code available for payment source | Attending: Emergency Medicine | Admitting: Emergency Medicine

## 2022-05-18 DIAGNOSIS — O210 Mild hyperemesis gravidarum: Secondary | ICD-10-CM | POA: Diagnosis not present

## 2022-05-18 DIAGNOSIS — Z79899 Other long term (current) drug therapy: Secondary | ICD-10-CM | POA: Diagnosis not present

## 2022-05-18 DIAGNOSIS — O219 Vomiting of pregnancy, unspecified: Secondary | ICD-10-CM | POA: Diagnosis present

## 2022-05-18 DIAGNOSIS — Z3A09 9 weeks gestation of pregnancy: Secondary | ICD-10-CM | POA: Insufficient documentation

## 2022-05-18 HISTORY — DX: Anxiety disorder, unspecified: F41.9

## 2022-05-18 HISTORY — DX: Depression, unspecified: F32.A

## 2022-05-18 LAB — COMPREHENSIVE METABOLIC PANEL
ALT: 17 U/L (ref 0–44)
AST: 17 U/L (ref 15–41)
Albumin: 3.8 g/dL (ref 3.5–5.0)
Alkaline Phosphatase: 40 U/L (ref 38–126)
Anion gap: 6 (ref 5–15)
BUN: <5 mg/dL — ABNORMAL LOW (ref 6–20)
CO2: 23 mmol/L (ref 22–32)
Calcium: 9.3 mg/dL (ref 8.9–10.3)
Chloride: 107 mmol/L (ref 98–111)
Creatinine, Ser: 0.59 mg/dL (ref 0.44–1.00)
GFR, Estimated: >60 mL/min (ref >60)
Glucose, Bld: 90 mg/dL (ref 70–99)
Potassium: 3.4 mmol/L — ABNORMAL LOW (ref 3.5–5.1)
Sodium: 136 mmol/L (ref 135–145)
Total Bilirubin: 0.7 mg/dL (ref 0.3–1.2)
Total Protein: 6.6 g/dL (ref 6.5–8.1)

## 2022-05-18 LAB — URINALYSIS, ROUTINE W REFLEX MICROSCOPIC
Bilirubin Urine: NEGATIVE
Glucose, UA: NEGATIVE mg/dL
Hgb urine dipstick: NEGATIVE
Ketones, ur: 20 mg/dL — AB
Leukocytes,Ua: NEGATIVE
Nitrite: NEGATIVE
Protein, ur: NEGATIVE mg/dL
Specific Gravity, Urine: 1.01 (ref 1.005–1.030)
pH: 8 (ref 5.0–8.0)

## 2022-05-18 LAB — RAPID URINE DRUG SCREEN, HOSP PERFORMED
Amphetamines: NOT DETECTED
Barbiturates: NOT DETECTED
Benzodiazepines: NOT DETECTED
Cocaine: NOT DETECTED
Opiates: NOT DETECTED
Tetrahydrocannabinol: POSITIVE — AB

## 2022-05-18 LAB — CBC
HCT: 35.7 % — ABNORMAL LOW (ref 36.0–46.0)
Hemoglobin: 12.4 g/dL (ref 12.0–15.0)
MCH: 32 pg (ref 26.0–34.0)
MCHC: 34.7 g/dL (ref 30.0–36.0)
MCV: 92.2 fL (ref 80.0–100.0)
Platelets: 223 10*3/uL (ref 150–400)
RBC: 3.87 MIL/uL (ref 3.87–5.11)
RDW: 12.7 % (ref 11.5–15.5)
WBC: 6.6 10*3/uL (ref 4.0–10.5)
nRBC: 0 % (ref 0.0–0.2)

## 2022-05-18 LAB — LIPASE, BLOOD: Lipase: 24 U/L (ref 11–51)

## 2022-05-18 MED ORDER — ONDANSETRON HCL 4 MG/2ML IJ SOLN
4.0000 mg | Freq: Once | INTRAMUSCULAR | Status: AC
  Administered 2022-05-18: 4 mg via INTRAVENOUS
  Filled 2022-05-18: qty 2

## 2022-05-18 MED ORDER — LACTATED RINGERS IV BOLUS
1000.0000 mL | Freq: Once | INTRAVENOUS | Status: AC
  Administered 2022-05-18: 1000 mL via INTRAVENOUS

## 2022-05-18 MED ORDER — PROMETHAZINE HCL 12.5 MG RE SUPP: 25.0000 mg | Freq: Three times a day (TID) | RECTAL | 0 refills | Status: AC | PRN

## 2022-05-18 MED ORDER — SODIUM CHLORIDE 0.9 % IV BOLUS
1000.0000 mL | Freq: Once | INTRAVENOUS | Status: AC
  Administered 2022-05-18: 1000 mL via INTRAVENOUS

## 2022-05-18 NOTE — ED Provider Notes (Signed)
[facility] EMERGENCY DEPARTMENT Provider Note   CSN: [id number] Arrival date & time: 05/18/22  1141     History  Chief Complaint  Patient presents with   Emesis During Pregnancy    Carmen Sanders is a 26 y.o. female.  HPI  Patient without significant medical history presents with complaints of nausea vomiting.  Patient states that she is [redacted] weeks pregnant believes she is having hyperemesis from her pregnancy.  States that she has been taking her Zofran without much relief.  States that she been nauseous and vomiting since Friday, been unable to tolerate p.o., states she has had episodes of diarrhea denies melena or hematochezia.  She does state that she saw some slight slight bloodstained vomit but denies coffee-ground emesis.  Patient's has had her appendix removed has no other significant abdominal history denies NSAID use alcohol use denies PUD, not on anticoag's, she endorses that this is her first pregnancy, she is currently being followed by [facility], had ultrasounds which were unremarkable.  She denies any vaginal discharge vaginal bleeding occasional pelvic cramping states feel little bit worse than usual but thinks this is from all the vomiting.  Review patient's chart she has had 6 weeks fetal ultrasound, I am unable to physically see the imaging but per the OB/GYN notes mild will IUP fetal heart rate of 115, length consistent with 6 weeks 1 day.  This was on 05/16    Home Medications Prior to Admission medications   Medication Sig Start Date End Date Taking? Authorizing Provider  ondansetron (ZOFRAN-ODT) 4 MG disintegrating tablet Take 4 mg by mouth every 8 (eight) hours as needed for nausea or vomiting. 03/27/22  Yes [provider]  Prenatal Vit-Fe Fumarate-FA (PRENATAL MULTIVITAMIN) TABS tablet Take 1 tablet by mouth daily at 12 noon.   Yes [provider]  promethazine (PHENERGAN) 12.5 MG suppository Place 2 suppositories (25 mg total) rectally  every 8 (eight) hours as needed for up to 5 days for nausea or vomiting. 05/18/22 05/23/22 Yes Marcello Fennel, PA-C      Allergies    Horse-derived products and Hpv 4-valent vaccine recombinant vaccine    Review of Systems   Review of Systems  Constitutional:  Negative for chills and fever.  Respiratory:  Negative for shortness of breath.   Cardiovascular:  Negative for chest pain.  Gastrointestinal:  Positive for nausea and vomiting. Negative for abdominal pain.  Neurological:  Negative for headaches.   Physical Exam Updated Vital Signs BP 126/77   Pulse (!) 57   Temp 98.6 F (37 C) (Oral)   Resp 18   Ht _0  (1.575 m)   Wt 101.8 kg   SpO2 100%   BMI 41.06 kg/m  Physical Exam Vitals and nursing note reviewed.  Constitutional:      General: She is not in acute distress.    Appearance: She is not ill-appearing.  HENT:     Head: Normocephalic and atraumatic.     Nose: No congestion.  Eyes:     Conjunctiva/sclera: Conjunctivae normal.  Cardiovascular:     Rate and Rhythm: Normal rate and regular rhythm.     Pulses: Normal pulses.     Heart sounds: No murmur heard.   No friction rub. No gallop.  Pulmonary:     Effort: No respiratory distress.     Breath sounds: No wheezing, rhonchi or rales.  Abdominal:     Palpations: Abdomen is soft.     Tenderness: There is  no abdominal tenderness. There is no right CVA tenderness or left CVA tenderness.     Comments: Nondistended, no active bowel sounds dull to percussion, unable to feel fundal height, very mild tenderness in the lower pelvic region, has had pain while at this time palpated, there is no guarding rebound has peritoneal sign negative Delman Cheadle point no CVA tenderness.  Musculoskeletal:     Right lower leg: No edema.     Left lower leg: No edema.  Skin:    General: Skin is warm and dry.  Neurological:     Mental Status: She is alert.  Psychiatric:        Mood and Affect: Mood normal.    ED Results /  Procedures / Treatments   Labs (all labs ordered are listed, but only abnormal results are displayed) Labs Reviewed  COMPREHENSIVE METABOLIC PANEL - Abnormal; Notable for the following components:      Result Value   Potassium 3.4 (*)    BUN <5 (*)    All other components within normal limits  CBC - Abnormal; Notable for the following components:   HCT 35.7 (*)    All other components within normal limits  LIPASE, BLOOD  URINALYSIS, ROUTINE W REFLEX MICROSCOPIC  RAPID URINE DRUG SCREEN, HOSP PERFORMED    EKG None  Radiology No results found.  Procedures Procedures    Medications Ordered in ED Medications  ondansetron (ZOFRAN) injection 4 mg (4 mg Intravenous Given 05/18/22 1512)  sodium chloride 0.9 % bolus 1,000 mL (0 mLs Intravenous Stopped 05/18/22 1848)  lactated ringers bolus 1,000 mL (1,000 mLs Intravenous New Bag/Given 05/18/22 1859)  ondansetron (ZOFRAN) injection 4 mg (4 mg Intravenous Given 05/18/22 1900)    ED Course/ Medical Decision Making/ A&P                           Medical Decision Making Amount and/or Complexity of Data Reviewed Labs: ordered.  Risk Prescription drug management.   This patient presents to the ED for concern of nausea vomiting, this involves an extensive number of treatment options, and is a complaint that carries with it a high risk of complications and morbidity.  The differential diagnosis includes bowel obstruction, volvulus, hyperemesis, metabolic derailments    Additional history obtained:  Additional history obtained from grandmother at bedside External records from outside source obtained and reviewed including previous OB/GYN notes   Co morbidities that complicate the patient evaluation  [redacted] weeks pregnant  Social Determinants of Health:  N/A    Lab Tests:  I Ordered, and personally interpreted labs.  The pertinent results include: CBC unremarkable, CMP shows present at 3.4, lipase 24   Imaging Studies  ordered:  I ordered imaging studies including N/A I independently visualized and interpreted imaging which showed N/A I agree with the radiologist interpretation   Cardiac Monitoring:  The patient was maintained on a cardiac monitor.  I personally viewed and interpreted the cardiac monitored which showed an underlying rhythm of: N/A   Medicines ordered and prescription drug management:  I ordered medication including fluids, antiemetics I have reviewed the patients home medicines and have made adjustments as needed  Critical Interventions:  N/A   Reevaluation:  Presents with nausea and vomiting, she has a benign physical exam, this is likely secondary due to hyperemesis from pregnancy, she was provided Zofran and fluids, states she is feeling much better, lab work has been obtained we will await results.  Reassessed  after Zofran, patient states that she is drink some water feels nauseous again will provide with additional dose of Zofran and reassess.    Consultations Obtained:  N/A   Test Considered:  CT abdomen pelvis- will defer as my suspicion for intra-abdominal abnormality is very low at this time no surgical abdomen on my exam, no derangement seen on CMP or CBC.    Rule out Low suspicion for food bolus presentation atypical, she is not drooling my exam, this does not happen suddenly after eating.  I have low suspicion for liver or gallbladder abnormality as she has no right upper quadrant tenderness, liver enzymes, alk phos, T bili all within normal limits.  Low suspicion for pancreatitis as lipase is within normal limits.  Low suspicion for ruptured stomach ulcer as she has no peritoneal sign present on exam.  Low suspicion for bowel obstruction as abdomen is nondistended normal bowel sounds, so passing gas and having normal bowel movements.  Low suspicion for complicated diverticulitis as she is nontoxic-appearing, vital signs reassuring no leukocytosis present.  Low  suspicion for appendicitis as she has no right lower quadrant tenderness, vital signs reassuring.  Low suspicion for threatened abortion she has no pelvic pain, no vaginal discharge or vaginal bleeding.  Low suspicion for ectopic pregnancy she had imaging which confirms IUP and within the uterus.  I have low suspicion for intra-abdominal infection as she has low risk factors, vital signs reassuring, no leukocytosis, will defer imaging at this time.     Dispostion and problem list  Due to shift change patient may handoff to logan Joldersma pac  Reassessed the patient if she is tolerating p.o. can be discharged home with Phenergan follow-up with GI and for reassessment.            Final Clinical Impression(s) / ED Diagnoses Final diagnoses:  Hyperemesis arising during pregnancy    Rx / DC Orders ED Discharge Orders          Ordered    promethazine (PHENERGAN) 12.5 MG suppository  Every 8 hours PRN        05/18/22 1908              Aron Baba 05/18/22 Einar Crow    Lajean Saver, MD 05/18/22 1921

## 2022-05-18 NOTE — Discharge Instructions (Addendum)
Lab work was all reassuring given you Phenergan suppositories please take as prescribed May also use Zofran as needed. I recommend a bland diet  Please follow-up with your OB/GYN for further evaluation.  Come back to the emergency department if you develop chest pain, shortness of breath, severe abdominal pain, uncontrolled nausea, vomiting, diarrhea.

## 2022-05-18 NOTE — ED Provider Notes (Signed)
Patient is a 26 year old female whose care was transferred to me from [facility].  Please see his HPI below:  Patient without significant medical history presents with complaints of nausea vomiting.  Patient states that she is [redacted] weeks pregnant believes she is having hyperemesis from her pregnancy.  States that she has been taking her Zofran without much relief.  States that she been nauseous and vomiting since Friday, been unable to tolerate p.o., states she has had episodes of diarrhea denies melena or hematochezia.  She does state that she saw some slight slight bloodstained vomit but denies coffee-ground emesis.  Patient's has had her appendix removed has no other significant abdominal history denies NSAID use alcohol use denies PUD, not on anticoag's, she endorses that this is her first pregnancy, she is currently being followed by [facility], had ultrasounds which were unremarkable.  She denies any vaginal discharge vaginal bleeding occasional pelvic cramping states feel little bit worse than usual but thinks this is from all the vomiting.   Review patient's chart she has had 6 weeks fetal ultrasound, I am unable to physically see the imaging but per the OB/GYN notes mild will IUP fetal heart rate of 115, length consistent with 6 weeks 1 day.  Physical Exam  BP 135/86   Pulse 61   Temp 98.5 F (36.9 C) (Oral)   Resp 16   Ht 5\' 2"  (1.575 m)   Wt 101.8 kg   SpO2 100%   BMI 41.06 kg/m   Physical Exam Vitals and nursing note reviewed.  Constitutional:      General: She is not in acute distress.    Appearance: She is well-developed.  HENT:     Head: Normocephalic and atraumatic.     Right Ear: External ear normal.     Left Ear: External ear normal.  Eyes:     General: No scleral icterus.       Right eye: No discharge.        Left eye: No discharge.     Conjunctiva/sclera: Conjunctivae normal.  Neck:     Trachea: No tracheal deviation.  Cardiovascular:     Rate and Rhythm:  Normal rate.  Pulmonary:     Effort: Pulmonary effort is normal. No respiratory distress.     Breath sounds: No stridor.  Abdominal:     General: There is no distension.  Musculoskeletal:        General: No swelling or deformity.     Cervical back: Neck supple.  Skin:    General: Skin is warm and dry.     Findings: No rash.  Neurological:     Mental Status: She is alert.     Cranial Nerves: Cranial nerve deficit: no gross deficits.   Procedures  Procedures  ED Course / MDM    Medical Decision Making Amount and/or Complexity of Data Reviewed Labs: ordered.  Risk Prescription drug management.   Patient is a 26 year old female whose care was transferred to me by Gwenevere Ghazi at shift change.  Please see his note for additional information.  In summary, patient is a G1 female who is [redacted] weeks pregnant.  She presents today due to nausea and vomiting.  She has been taking Zofran without much relief.  Due to repetitive nausea and vomiting she came to the emergency department for further evaluation.  Lab work had resulted prior to shift change and patient was pending additional fluids, antiemetics, as well as reevaluation.  CBC with hematocrit of 35.7  but otherwise no abnormalities.  CMP with a potassium of 3.4 and BUN less than 5.  Lipase within normal limits at 24. UDS positive for tetrahydrocannabinol.  UA with 20 ketones.  Likely secondary to patient's dehydration.  We discussed patient's UDS results and she states that she was previously smoking marijuana and has stopped smoking since realizing that she was pregnant about 4 weeks ago.  Patient was treated with 2 L of IV fluids as well as IV Zofran.  She notes significant improvement in her symptoms.  She is now tolerating p.o. intake without difficulty.  She has Zofran at home as well.  Previous PA-C prescribed patient Phenergan as well for breakthrough nausea and vomiting.  Patient appears stable for discharge at this time and she is  agreeable.  We discussed return precautions.  Recommended OB/GYN follow-up.  Her questions were answered and she was amicable at the time of discharge.      Rayna Sexton, PA-C 05/18/22 2121    Lajean Saver, MD 06/03/22 1106

## 2022-05-18 NOTE — ED Triage Notes (Signed)
Pt presents with hx of hyperemesis gravidarum, but pt states today feels different. Pt has vomited 14+ in last 24 hours. Pt now having diarrhea, lower abd pain and chills. Denies vaginal bleeding. Pt is taking ondansetron without relief. Pt is [redacted] weeks pregnant. G1P0.

## 2022-05-18 NOTE — ED Notes (Signed)
Pt tolerated  PO fluids w/o complication

## 2023-03-14 ENCOUNTER — Other Ambulatory Visit: Payer: Self-pay

## 2023-03-14 ENCOUNTER — Emergency Department (HOSPITAL_COMMUNITY)
Admission: EM | Admit: 2023-03-14 | Discharge: 2023-03-14 | Disposition: A | Discharge status: Home / Self Care | Payer: No Typology Code available for payment source | Attending: Emergency Medicine | Admitting: Emergency Medicine

## 2023-03-14 ENCOUNTER — Encounter (HOSPITAL_COMMUNITY): Payer: Self-pay | Admitting: *Deleted

## 2023-03-14 DIAGNOSIS — K529 Noninfective gastroenteritis and colitis, unspecified: Secondary | ICD-10-CM

## 2023-03-14 DIAGNOSIS — R112 Nausea with vomiting, unspecified: Secondary | ICD-10-CM | POA: Diagnosis present

## 2023-03-14 LAB — COMPREHENSIVE METABOLIC PANEL
ALT: 12 U/L (ref 0–44)
AST: 14 U/L — ABNORMAL LOW (ref 15–41)
Albumin: 3.7 g/dL (ref 3.5–5.0)
Alkaline Phosphatase: 59 U/L (ref 38–126)
Anion gap: 8 (ref 5–15)
BUN: 10 mg/dL (ref 6–20)
CO2: 22 mmol/L (ref 22–32)
Calcium: 8.8 mg/dL — ABNORMAL LOW (ref 8.9–10.3)
Chloride: 106 mmol/L (ref 98–111)
Creatinine, Ser: 0.88 mg/dL (ref 0.44–1.00)
GFR, Estimated: >60 mL/min (ref >60)
Glucose, Bld: 121 mg/dL — ABNORMAL HIGH (ref 70–99)
Potassium: 3.3 mmol/L — ABNORMAL LOW (ref 3.5–5.1)
Sodium: 136 mmol/L (ref 135–145)
Total Bilirubin: 1 mg/dL (ref 0.3–1.2)
Total Protein: 6.9 g/dL (ref 6.5–8.1)

## 2023-03-14 LAB — CBC WITH DIFFERENTIAL/PLATELET
Abs Immature Granulocytes: 0.01 10*3/uL (ref 0.00–0.07)
Basophils Absolute: 0 10*3/uL (ref 0.0–0.1)
Basophils Relative: 0 %
Eosinophils Absolute: 0 10*3/uL (ref 0.0–0.5)
Eosinophils Relative: 0 %
HCT: 35.1 % — ABNORMAL LOW (ref 36.0–46.0)
Hemoglobin: 12 g/dL (ref 12.0–15.0)
Immature Granulocytes: 0 %
Lymphocytes Relative: 9 %
Lymphs Abs: 0.3 10*3/uL — ABNORMAL LOW (ref 0.7–4.0)
MCH: 30.5 pg (ref 26.0–34.0)
MCHC: 34.2 g/dL (ref 30.0–36.0)
MCV: 89.1 fL (ref 80.0–100.0)
Monocytes Absolute: 0.2 10*3/uL (ref 0.1–1.0)
Monocytes Relative: 6 %
Neutro Abs: 3.2 10*3/uL (ref 1.7–7.7)
Neutrophils Relative %: 85 %
Platelets: 192 10*3/uL (ref 150–400)
RBC: 3.94 MIL/uL (ref 3.87–5.11)
RDW: 13.1 % (ref 11.5–15.5)
WBC: 3.8 10*3/uL — ABNORMAL LOW (ref 4.0–10.5)
nRBC: 0 % (ref 0.0–0.2)

## 2023-03-14 LAB — I-STAT BETA HCG BLOOD, ED (MC, WL, AP ONLY): I-stat hCG, quantitative: <5 m[IU]/mL (ref <5)

## 2023-03-14 MED ORDER — LOPERAMIDE HCL 2 MG PO CAPS
4.0000 mg | ORAL_CAPSULE | Freq: Once | ORAL | Status: AC
  Administered 2023-03-14: 4 mg via ORAL
  Filled 2023-03-14: qty 2

## 2023-03-14 MED ORDER — ONDANSETRON HCL 4 MG PO TABS: 4.0000 mg | ORAL_TABLET | Freq: Three times a day (TID) | ORAL | 0 refills | Status: AC | PRN

## 2023-03-14 MED ORDER — SODIUM CHLORIDE 0.9 % IV BOLUS
1000.0000 mL | Freq: Once | INTRAVENOUS | Status: AC
  Administered 2023-03-14: 1000 mL via INTRAVENOUS

## 2023-03-14 MED ORDER — KETOROLAC TROMETHAMINE 15 MG/ML IJ SOLN
15.0000 mg | Freq: Once | INTRAMUSCULAR | Status: AC
  Administered 2023-03-14: 15 mg via INTRAVENOUS
  Filled 2023-03-14: qty 1

## 2023-03-14 MED ORDER — ONDANSETRON HCL 4 MG/2ML IJ SOLN
4.0000 mg | Freq: Once | INTRAMUSCULAR | Status: AC
  Administered 2023-03-14: 4 mg via INTRAVENOUS
  Filled 2023-03-14: qty 2

## 2023-03-14 MED ORDER — LOPERAMIDE HCL 2 MG PO CAPS: 2.0000 mg | ORAL_CAPSULE | Freq: Four times a day (QID) | ORAL | 0 refills | Status: AC | PRN

## 2023-03-14 NOTE — Discharge Instructions (Signed)
We evaluated you for your nausea and diarrhea.  Your laboratory testing was reassuring.  I have prescribed you medication to take as needed for nausea and diarrhea.  Please follow-up with your primary doctor.  Please return if you develop any bloody stools, severe abdominal pain, dizziness or fainting, bloody vomit, or any other concerning symptoms.

## 2023-03-14 NOTE — ED Triage Notes (Signed)
Pt c/o n/v/d that started last night  Pt also c/o back pain

## 2023-03-14 NOTE — ED Provider Notes (Signed)
[facility] Provider Note  CSN: [id number] Arrival date & time: 03/14/23 [id number]  Chief Complaint(s) Emesis  HPI Carmen Sanders is a 27 y.o. female without significant past medical history presenting with nausea, vomiting and diarrhea.  Reports that this began yesterday.  She denies abdominal pain.  Reports mild bilateral back ache.  No fevers, reports chills and bodyaches.  No hematemesis, hematochezia or melena.  No sick contacts.  No recent antibiotic use or travel.  Symptoms are mild.  Has been able to tolerate some fluids.  No urinary symptoms.   Past Medical History Past Medical History:  Diagnosis Date   Anxiety    Chlamydia 06/23/2016   Depression    Heart murmur    History of chlamydia 07/15/2016   Irregular menstrual bleeding 08/07/2014   Sickle cell trait ([place])    Vaginal discharge 04/25/2015   Vaginal itching 06/16/2016   Yeast vaginitis 08/07/2014   Patient Active Problem List   Diagnosis Date Noted   History of chlamydia 07/15/2016   Chlamydia 06/23/2016   Vaginal itching 06/16/2016   BV (bacterial vaginosis) 09/04/2015   Acute appendicitis 07/25/2015   Vaginal discharge 04/25/2015   Irregular menstrual bleeding 08/07/2014   Yeast vaginitis 08/07/2014   Home Medication(s) Prior to Admission medications   Medication Sig Start Date End Date Taking? Authorizing Provider  loperamide (IMODIUM) 2 MG capsule Take 1 capsule (2 mg total) by mouth 4 (four) times daily as needed for diarrhea or loose stools. 03/14/23  Yes Cristie Hem, MD  ondansetron (ZOFRAN) 4 MG tablet Take 1 tablet (4 mg total) by mouth every 8 (eight) hours as needed for nausea or vomiting. 03/14/23  Yes Cristie Hem, MD  ondansetron (ZOFRAN-ODT) 4 MG disintegrating tablet Take 4 mg by mouth every 8 (eight) hours as needed for nausea or vomiting. 03/27/22   [provider]  Prenatal Vit-Fe Fumarate-FA (PRENATAL MULTIVITAMIN) TABS tablet  Take 1 tablet by mouth daily at 12 noon.    [provider]  promethazine (PHENERGAN) 12.5 MG suppository Place 2 suppositories (25 mg total) rectally every 8 (eight) hours as needed for up to 5 days for nausea or vomiting. 05/18/22 05/23/22  Marcello Fennel, PA-C                                                                                                                                    Past Surgical History Past Surgical History:  Procedure Laterality Date   LAPAROSCOPIC APPENDECTOMY N/A 07/26/2015   Procedure: APPENDECTOMY LAPAROSCOPIC;  Surgeon: Aviva Signs, MD;  Location: [facility];  Service: General;  Laterality: N/A;   NO PAST SURGERIES     Family History Family History  Problem Relation Age of Onset   Diabetes Mother    Hypertension Maternal Grandmother     Social History Social History   Tobacco Use   Smoking status: Never  Smokeless tobacco: Never  Vaping Use   Vaping Use: Never used  Substance Use Topics   Alcohol use: No   Drug use: Not Currently    Types: Marijuana   Allergies Horse-derived products and Hpv 4-valent vaccine recombinant vaccine  Review of Systems Review of Systems  All other systems reviewed and are negative.   Physical Exam Vital Signs  I have reviewed the triage vital signs BP (!) 146/87 (BP Location: Left Arm)   Pulse 90   Temp 98 F (36.7 C) (Oral)   Resp 18   SpO2 99%  Physical Exam Vitals and nursing note reviewed.  Constitutional:      General: She is not in acute distress.    Appearance: She is well-developed.  HENT:     Head: Normocephalic and atraumatic.     Mouth/Throat:     Mouth: Mucous membranes are dry.  Eyes:     Pupils: Pupils are equal, round, and reactive to light.  Cardiovascular:     Rate and Rhythm: Normal rate and regular rhythm.     Heart sounds: No murmur heard. Pulmonary:     Effort: Pulmonary effort is normal. No respiratory distress.     Breath sounds: Normal breath sounds.  Abdominal:      General: Abdomen is flat.     Palpations: Abdomen is soft.     Tenderness: There is no abdominal tenderness.  Musculoskeletal:        General: No tenderness.     Right lower leg: No edema.     Left lower leg: No edema.  Skin:    General: Skin is warm and dry.  Neurological:     General: No focal deficit present.     Mental Status: She is alert. Mental status is at baseline.  Psychiatric:        Mood and Affect: Mood normal.        Behavior: Behavior normal.     ED Results and Treatments Labs (all labs ordered are listed, but only abnormal results are displayed) Labs Reviewed  COMPREHENSIVE METABOLIC PANEL - Abnormal; Notable for the following components:      Result Value   Potassium 3.3 (*)    Glucose, Bld 121 (*)    Calcium 8.8 (*)    AST 14 (*)    All other components within normal limits  CBC WITH DIFFERENTIAL/PLATELET - Abnormal; Notable for the following components:   WBC 3.8 (*)    HCT 35.1 (*)    Lymphs Abs 0.3 (*)    All other components within normal limits  I-STAT BETA HCG BLOOD, ED ([facility], [facility], [facility] ONLY)                                                                                                                          Radiology No results found.  Pertinent labs & imaging results that were available during my care of the patient were reviewed by me and considered in my  medical decision making (see MDM for details).  Medications Ordered in ED Medications  sodium chloride 0.9 % bolus 1,000 mL (1,000 mLs Intravenous New Bag/Given 03/14/23 0921)  ondansetron (ZOFRAN) injection 4 mg (4 mg Intravenous Given 03/14/23 0923)  loperamide (IMODIUM) capsule 4 mg (4 mg Oral Given 03/14/23 0925)  ketorolac (TORADOL) 15 MG/ML injection 15 mg (15 mg Intravenous Given 03/14/23 [id number])                                                                                                                                     Procedures Procedures  (including critical care  time)  Medical Decision Making / ED Course   MDM:  27 year old female presenting to the emergency department with nausea, vomiting and diarrhea.  Patient well-appearing, physical exam is reassuring.  No abdominal tenderness.  Suspect symptoms are most likely due to viral gastroenteritis.  No abdominal tenderness to suggest appendicitis, cholecystitis or other acute intra-abdominal pathology such as perforation.  Pregnancy test is negative.  No bloody stools to suggest invasive diarrhea.  No risk factors for C. difficile such as hospitalization or recent antibiotic use.  Patient appeared mildly dehydrated, received IV fluids with improvement, also received nausea medication with improvement in symptoms. Will discharge patient to home. All questions answered. Patient comfortable with plan of discharge. Return precautions discussed with patient and specified on the after visit summary.       Additional history obtained: -External records from outside source obtained and reviewed including: Chart review including previous notes, labs, imaging, consultation notes including OB admission 11/26/22   Lab Tests: -I ordered, reviewed, and interpreted labs.   The pertinent results include:   Labs Reviewed  COMPREHENSIVE METABOLIC PANEL - Abnormal; Notable for the following components:      Result Value   Potassium 3.3 (*)    Glucose, Bld 121 (*)    Calcium 8.8 (*)    AST 14 (*)    All other components within normal limits  CBC WITH DIFFERENTIAL/PLATELET - Abnormal; Notable for the following components:   WBC 3.8 (*)    HCT 35.1 (*)    Lymphs Abs 0.3 (*)    All other components within normal limits  I-STAT BETA HCG BLOOD, ED ([facility], [facility], [facility] ONLY)    Notable for mild hypokalemia, encouraged high potassium diet e.g. banana   Medicines ordered and prescription drug management: Meds ordered this encounter  Medications   sodium chloride 0.9 % bolus 1,000 mL   ondansetron (ZOFRAN) injection 4  mg   loperamide (IMODIUM) capsule 4 mg   ketorolac (TORADOL) 15 MG/ML injection 15 mg   ondansetron (ZOFRAN) 4 MG tablet    Sig: Take 1 tablet (4 mg total) by mouth every 8 (eight) hours as needed for nausea or vomiting.    Dispense:  12 tablet    Refill:  0   loperamide (IMODIUM) 2 MG capsule    Sig:  Take 1 capsule (2 mg total) by mouth 4 (four) times daily as needed for diarrhea or loose stools.    Dispense:  12 capsule    Refill:  0    -I have reviewed the patients home medicines and have made adjustments as needed   Social Determinants of Health:  Diagnosis or treatment significantly limited by social determinants of health: obesity   Reevaluation: After the interventions noted above, I reevaluated the patient and found that their symptoms have improved  Co morbidities that complicate the patient evaluation  Past Medical History:  Diagnosis Date   Anxiety    Chlamydia 06/23/2016   Depression    Heart murmur    History of chlamydia 07/15/2016   Irregular menstrual bleeding 08/07/2014   Sickle cell trait ([place])    Vaginal discharge 04/25/2015   Vaginal itching 06/16/2016   Yeast vaginitis 08/07/2014      Dispostion: Disposition decision including need for hospitalization was considered, and patient discharged from emergency department.    Final Clinical Impression(s) / ED Diagnoses Final diagnoses:  Gastroenteritis     This chart was dictated using voice recognition software.  Despite best efforts to proofread,  errors can occur which can change the documentation meaning.    Cristie Hem, MD 03/14/23 [phone]
